# Patient Record
Sex: Male | Born: 1990 | Hispanic: No
Health system: Southern US, Community
[De-identification: ages and names within clinical notes are randomized; demographics above are authoritative.]

---

## 2013-07-26 ENCOUNTER — Other Ambulatory Visit: Payer: Self-pay | Admitting: Internal Medicine

## 2013-07-26 DIAGNOSIS — N50819 Testicular pain, unspecified: Secondary | ICD-10-CM

## 2013-07-27 ENCOUNTER — Ambulatory Visit
Admission: RE | Admit: 2013-07-27 | Discharge: 2013-07-27 | Disposition: A | Discharge status: Home / Self Care | Payer: BC Managed Care – PPO | Source: Ambulatory Visit | Attending: Internal Medicine | Admitting: Internal Medicine

## 2013-07-27 DIAGNOSIS — N50819 Testicular pain, unspecified: Secondary | ICD-10-CM

## 2015-05-26 IMAGING — US US SCROTUM
1 series · 14 of 25 positions shown · non-contrast
Comparison: None.

CLINICAL DATA: Left testicular pain.  Recent trauma.

EXAM:
SCROTAL ULTRASOUND
DOPPLER ULTRASOUND OF THE TESTICLES
TECHNIQUE: Complete ultrasound examination of the testicles, epididymis, and
other scrotal structures was performed. Color and spectral Doppler
ultrasound were also utilized to evaluate blood flow to the
testicles.

[Series 1: us scrotum · 0.08mm/px · 14 of 66 slices shown]
[im 1/66]
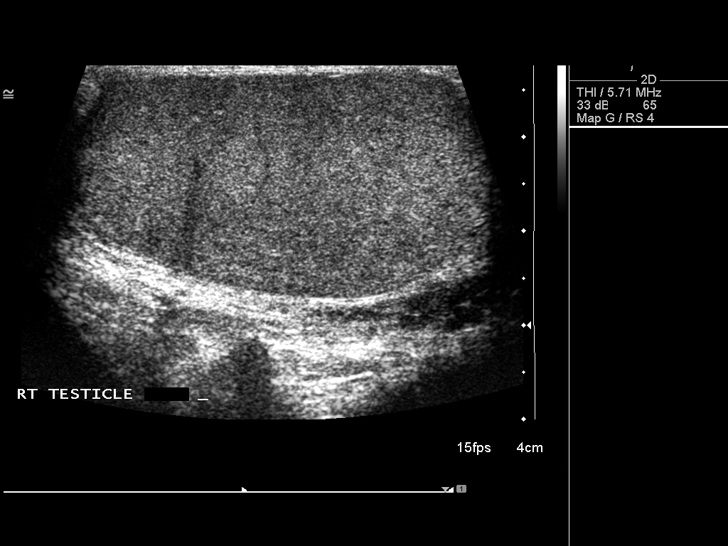
[im 6/66]
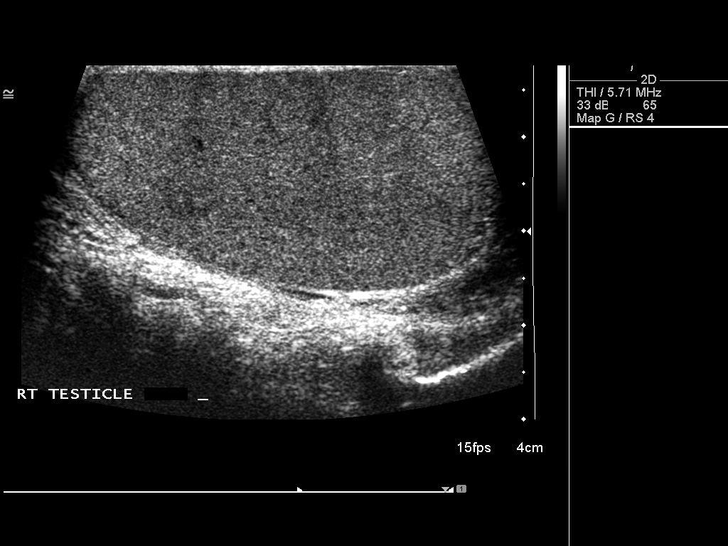
[im 11/66]
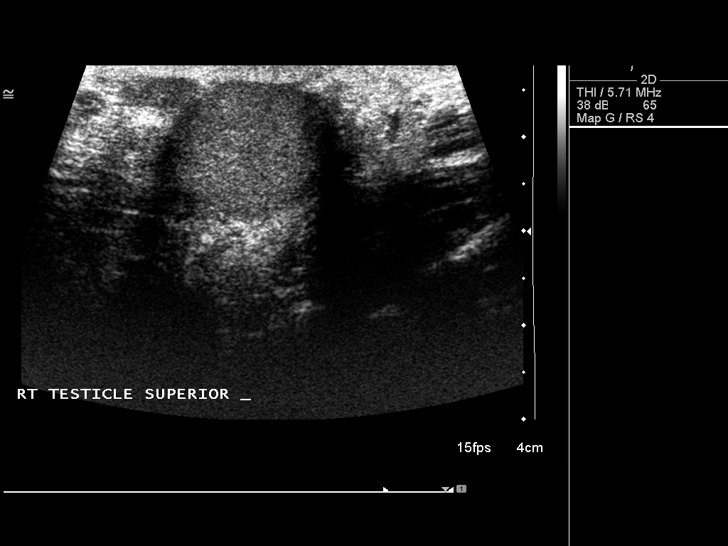
[im 17/66]
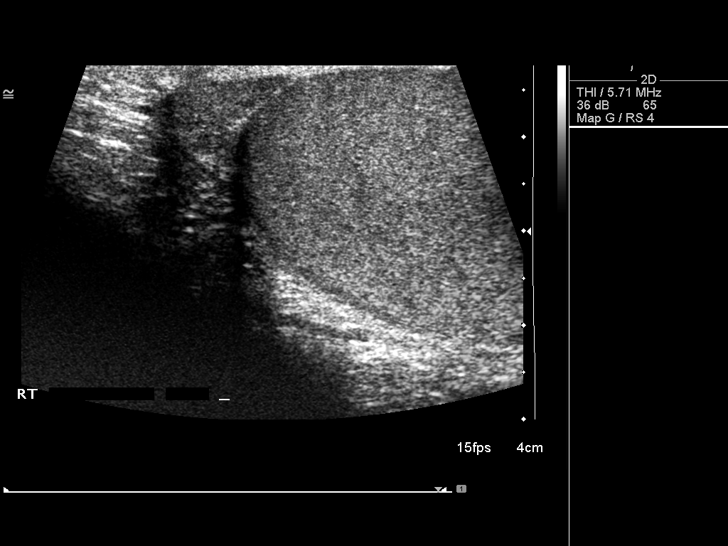
[im 22/66]
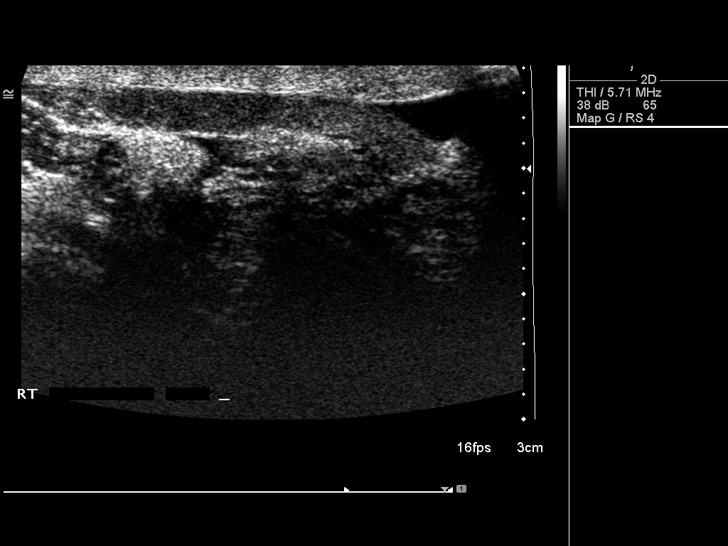
[im 25/66]
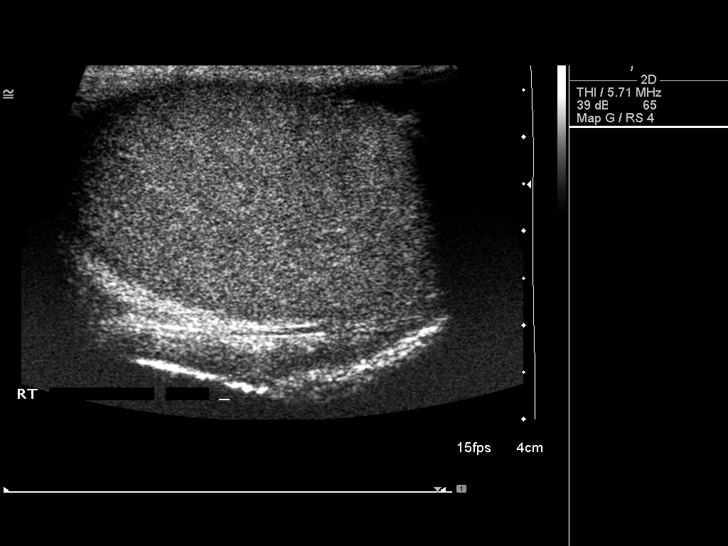
[im 30/66]
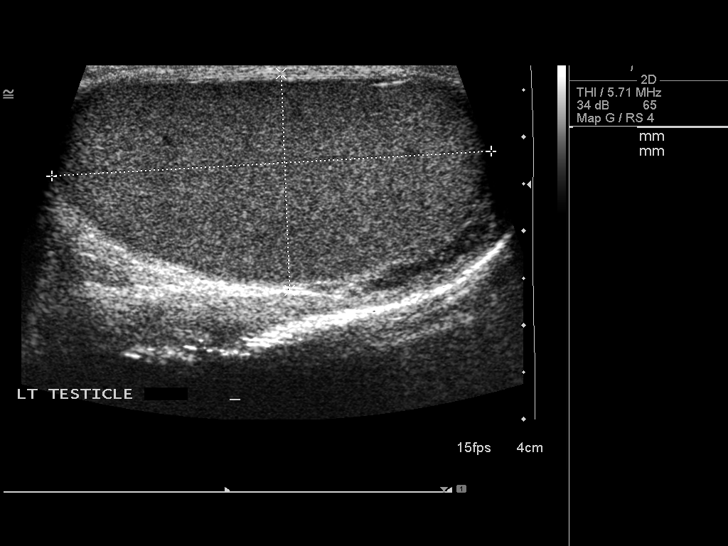
[im 36/66]
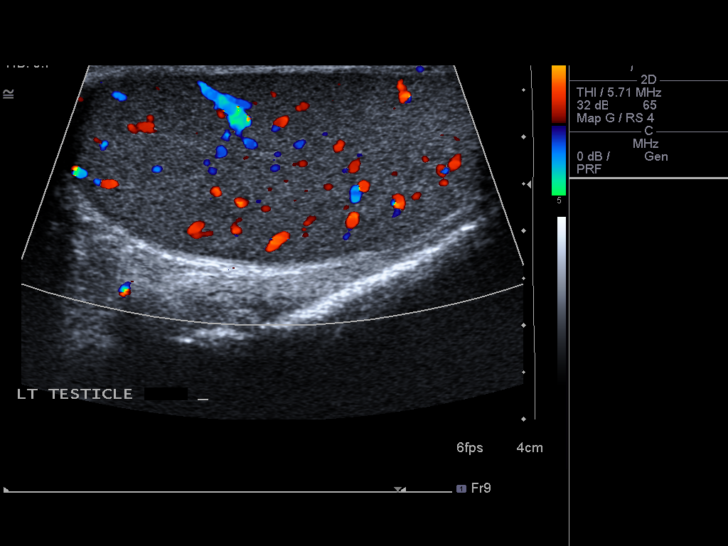
[im 41/66]
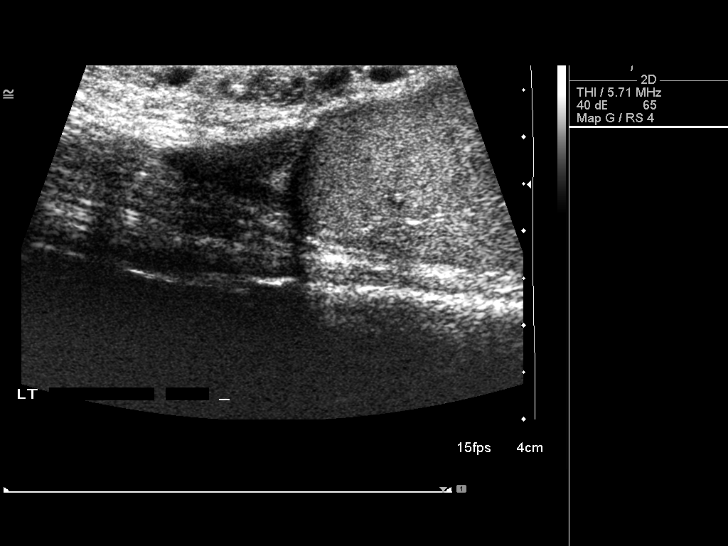
[im 44/66]
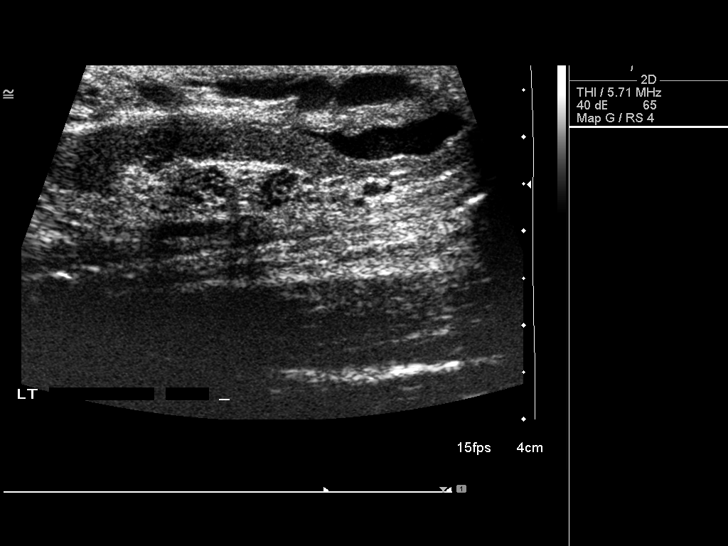
[im 49/66]
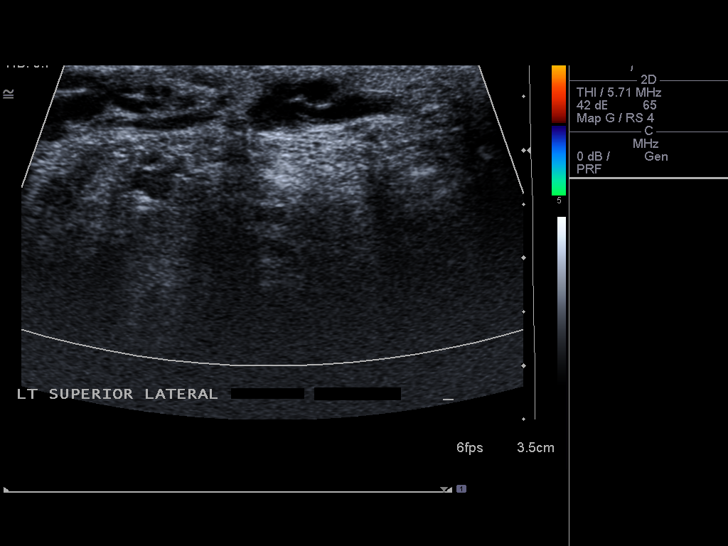
[im 55/66]
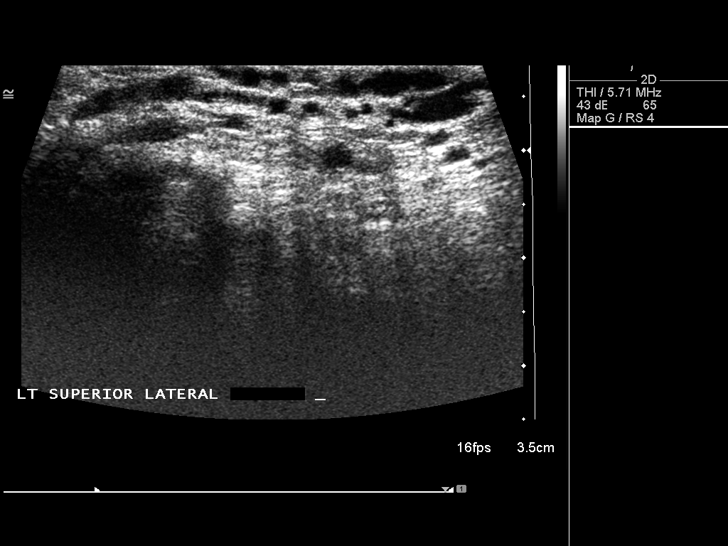
[im 60/66]
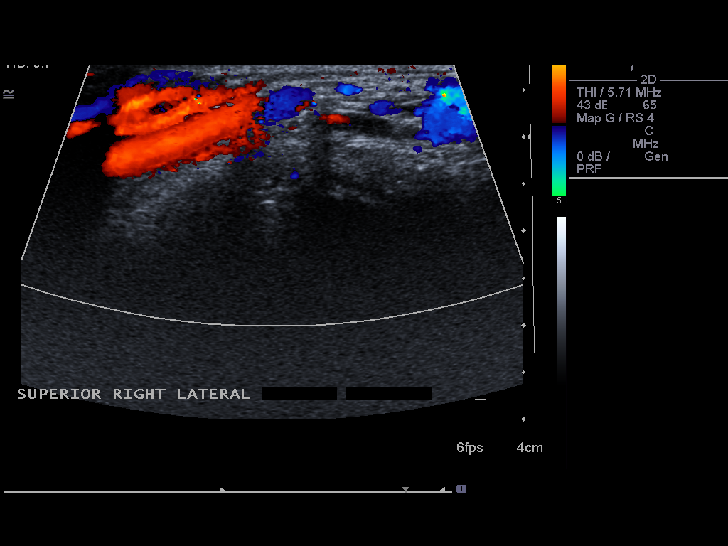
[im 66/66]
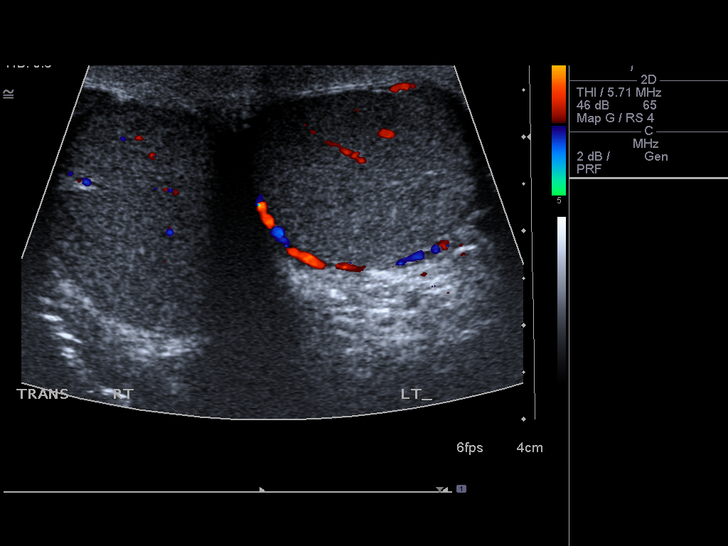

[14 of 25 positions shown; findings below may reference images not displayed]

FINDINGS: Right testicle

Measurements: 4.8 x 2.5 x 2.7 cm. Symmetric and homogeneous
echotexture without focal lesion. Patent arterial and venous blood
flow.

Left testicle

Measurements: 4.7 x 2.3 x 2.9 cm. Symmetric and homogeneous
echotexture without focal lesion. Patent arterial and venous blood
flow.

Right epididymis:  Normal in size and appearance.

Left epididymis:  Normal in size and appearance.

Hydrocele:  None visualized.

Varicocele:  Bilateral varicoceles, left greater than right

Pulsed Doppler interrogation of both testes demonstrates low
resistance arterial and venous waveforms bilaterally.
IMPRESSION: 1. Normal sonographic appearance of both testicles and patent
intratesticular blood flow bilaterally.
2. Bilateral varicoceles, left greater than right.
